# Patient Record
Sex: Female | Born: 1940 | Race: White | Hispanic: No | Marital: Married
Health system: Southern US, Community
[De-identification: ages and names within clinical notes are randomized; demographics above are authoritative.]

---

## 2003-01-29 ENCOUNTER — Encounter: Payer: Self-pay | Admitting: Thoracic Surgery (Cardiothoracic Vascular Surgery)

## 2003-01-31 ENCOUNTER — Inpatient Hospital Stay (HOSPITAL_COMMUNITY)
Admission: RE | Admit: 2003-01-31 | Discharge: 2003-02-04 | Payer: Self-pay | Admitting: Thoracic Surgery (Cardiothoracic Vascular Surgery)

## 2003-01-31 ENCOUNTER — Encounter: Payer: Self-pay | Admitting: Thoracic Surgery (Cardiothoracic Vascular Surgery)

## 2003-02-01 ENCOUNTER — Encounter: Payer: Self-pay | Admitting: Thoracic Surgery (Cardiothoracic Vascular Surgery)

## 2003-02-02 ENCOUNTER — Encounter: Payer: Self-pay | Admitting: Thoracic Surgery (Cardiothoracic Vascular Surgery)

## 2003-02-03 ENCOUNTER — Encounter: Payer: Self-pay | Admitting: Thoracic Surgery (Cardiothoracic Vascular Surgery)

## 2003-02-03 ENCOUNTER — Encounter: Payer: Self-pay | Admitting: Cardiothoracic Surgery

## 2003-02-04 ENCOUNTER — Encounter: Payer: Self-pay | Admitting: Cardiothoracic Surgery

## 2003-02-26 ENCOUNTER — Encounter
Admission: RE | Admit: 2003-02-26 | Discharge: 2003-02-26 | Payer: Self-pay | Admitting: Thoracic Surgery (Cardiothoracic Vascular Surgery)

## 2003-02-26 ENCOUNTER — Encounter: Payer: Self-pay | Admitting: Thoracic Surgery (Cardiothoracic Vascular Surgery)

## 2017-03-27 ENCOUNTER — Emergency Department (HOSPITAL_COMMUNITY): Payer: Medicare Other

## 2017-03-27 ENCOUNTER — Emergency Department (HOSPITAL_COMMUNITY)
Admission: EM | Admit: 2017-03-27 | Discharge: 2017-03-27 | Disposition: A | Discharge status: Home / Self Care | Payer: Medicare Other | Attending: Emergency Medicine | Admitting: Emergency Medicine

## 2017-03-27 DIAGNOSIS — Y939 Activity, unspecified: Secondary | ICD-10-CM | POA: Insufficient documentation

## 2017-03-27 DIAGNOSIS — S4992XA Unspecified injury of left shoulder and upper arm, initial encounter: Secondary | ICD-10-CM | POA: Diagnosis present

## 2017-03-27 DIAGNOSIS — S43035A Inferior dislocation of left humerus, initial encounter: Secondary | ICD-10-CM | POA: Diagnosis not present

## 2017-03-27 DIAGNOSIS — W01190A Fall on same level from slipping, tripping and stumbling with subsequent striking against furniture, initial encounter: Secondary | ICD-10-CM | POA: Diagnosis not present

## 2017-03-27 DIAGNOSIS — Y999 Unspecified external cause status: Secondary | ICD-10-CM | POA: Insufficient documentation

## 2017-03-27 DIAGNOSIS — Y9222 Religious institution as the place of occurrence of the external cause: Secondary | ICD-10-CM | POA: Insufficient documentation

## 2017-03-27 MED ORDER — HYDROMORPHONE HCL 1 MG/ML IJ SOLN
0.5000 mg | Freq: Once | INTRAMUSCULAR | Status: AC
  Administered 2017-03-27: 0.5 mg via INTRAVENOUS
  Filled 2017-03-27: qty 1

## 2017-03-27 MED ORDER — ONDANSETRON HCL 4 MG/2ML IJ SOLN
4.0000 mg | Freq: Once | INTRAMUSCULAR | Status: AC
  Administered 2017-03-27: 4 mg via INTRAVENOUS
  Filled 2017-03-27: qty 2

## 2017-03-27 MED ORDER — HYDROMORPHONE HCL 1 MG/ML IJ SOLN: 1.0000 mg | Freq: Once | INTRAMUSCULAR | Status: DC

## 2017-03-27 NOTE — ED Provider Notes (Signed)
[facility]-EMERGENCY DEPT Provider Note   CSN: [id number] Arrival date & time: 03/27/17  1326     History   Chief Complaint No chief complaint on file.   HPI Tiffany Johnson is a 76 y.o. female who presents with a fall and L shoulder pain. She was at church and tripped between two chairs, hit a wall, and then fell on the floor. She denies LOC. EMS transported patient due to severe left shoulder pain. She denies headache, neck pain, chest pain, SOB, abdominal pain, other extremity pain. She does have numbness of her left hand is unable to move her left fingers and wrist.   HPI  No past medical history on file.  There are no active problems to display for this patient.   No past surgical history on file.  OB History    No data available       Home Medications    Prior to Admission medications   Not on File    Family History No family history on file.  Social History Social History  Substance Use Topics  . Smoking status: Not on file  . Smokeless tobacco: Not on file  . Alcohol use Not on file     Allergies   Patient has no allergy information on record.   Review of Systems Review of Systems  Respiratory: Negative for shortness of breath.   Cardiovascular: Negative for chest pain.  Gastrointestinal: Negative for abdominal pain.  Musculoskeletal: Positive for arthralgias, joint swelling and myalgias. Negative for gait problem and neck pain.  Skin: Negative for wound.  Neurological: Negative for dizziness, syncope, weakness and headaches.  Psychiatric/Behavioral: Negative for confusion.  All other systems reviewed and are negative.    Physical Exam Updated Vital Signs BP (!) 164/78 (BP Location: Right Arm)   Pulse 71   Temp 97.6 F (36.4 C) (Oral)   Resp 16   SpO2 100%   Physical Exam  Constitutional: She is oriented to person, place, and time. She appears well-developed and well-nourished. She appears distressed.  HENT:    Head: Normocephalic.  Minimal bruising over forehead  Eyes: Pupils are equal, round, and reactive to light. Conjunctivae are normal. Right eye exhibits no discharge. Left eye exhibits no discharge. No scleral icterus.  Neck: Normal range of motion.  Cardiovascular: Normal rate and regular rhythm.  Exam reveals no gallop and no friction rub.   No murmur heard. Pulmonary/Chest: Effort normal and breath sounds normal. No respiratory distress. She has no wheezes. She has no rales. She exhibits no tenderness.  Abdominal: Soft. Bowel sounds are normal. She exhibits no distension. There is no tenderness.  Musculoskeletal:  Left upper extremity: Obvious deformity of left shoulder - patient is in sling and holding arm to chest with elbow flexed. Diffuse tenderness to palpation of shoulder. No tenderness of humerus, elbow, forearm, wrist, hand. Absent radial pulse. Unable to wiggle fingers.  Right knee: Ecchymosis of knee. No obvious swelling, deformity, or warmth. Minimal tenderness. FROM.   Neurological: She is alert and oriented to person, place, and time.  Skin: Skin is warm and dry.  Psychiatric: She has a normal mood and affect. Her behavior is normal.  Nursing note and vitals reviewed.    ED Treatments / Results  Labs (all labs ordered are listed, but only abnormal results are displayed) Labs Reviewed - No data to display  EKG  EKG Interpretation None       Radiology Dg Shoulder Left Portable  Result Date:  03/27/2017 CLINICAL DATA:  Pain after trauma EXAM: LEFT SHOULDER - 1 VIEW COMPARISON:  None. FINDINGS: Irregularity along the lateral humeral head is incompletely evaluated due the lack of an externally rotated view. However, the findings are suspicious for fracture fragments. No fracture lines are seen extending through the proximal humerus. No dislocation identified. The clavicle and scapula are grossly intact. IMPRESSION: 1. Irregularity along the lateral left humeral head is  concerning for subtle fracture fragments. A donor site is not well identified on this study due to lack of external rotation. No fracture lines are seen extending through the humeral head or neck. No dislocation. Electronically Signed   By: Gerome Samavid  Williams III M.D   On: 03/27/2017 15:12   Dg Knee Right Port  Result Date: 03/27/2017 CLINICAL DATA:  Pain after fall. EXAM: PORTABLE RIGHT KNEE - 1-2 VIEW COMPARISON:  None. FINDINGS: No evidence of fracture, dislocation, or joint effusion. No evidence of arthropathy or other focal bone abnormality. Soft tissues are unremarkable. IMPRESSION: Negative. Electronically Signed   By: Gerome Samavid  Williams III M.D   On: 03/27/2017 15:09    Procedures Procedures (including critical care time)  Medications Ordered in ED Medications  ondansetron [facility](ZOFRAN) injection 4 mg (4 mg Intravenous Given 03/27/17 1408)  HYDROmorphone (DILAUDID) injection 0.5 mg (0.5 mg Intravenous Given 03/27/17 1408)     Initial Impression / Assessment and Plan / ED Course  I have reviewed the triage vital signs and the nursing notes.  Pertinent labs & imaging results that were available during my care of the patient were reviewed by me and considered in my medical decision making (see chart for details).  76 year old female presents with inferior shoulder dislocation after a mechanical fall. Shoulder was successfully reduced by Dr. Silverio LayYao. Pt reported immediate improvement in symptoms after reduction. Portable shoulder and knee xray were obtained which shows small fracture fragments. Pt was placed in sling. Advised Tylenol/Ibuprofen for pain and f/u with Ortho. Patient and sister verbalized understanding.  Final Clinical Impressions(s) / ED Diagnoses   Final diagnoses:  Luxatio erecta of left shoulder, initial encounter    New Prescriptions New Prescriptions   No medications on file     Bethel BornGekas, Bradden Tadros Marie, PA-C 03/27/17 1600    Charlynne PanderYao, David Hsienta, MD 03/28/17 [phone]

## 2017-03-27 NOTE — Discharge Instructions (Signed)
Take Tylenol or Ibuprofen for pain Use sling for the next week Follow up with Orthopedics

## 2017-03-27 NOTE — ED Notes (Signed)
Bed: [id number] Expected date:  Expected time:  Means of arrival:  Comments: Shoulder injury.

## 2017-03-27 NOTE — ED Triage Notes (Signed)
Patient came by ems with c/o fall and shoulder injury. Pt fell at church and tripe over a chair. Pt c/o left shoulder pain at this time. Possible deformity. Pt received 100 mcg of fentanyl per ems.

## 2017-03-28 NOTE — ED Provider Notes (Signed)
Medical screening examination/treatment/procedure(s) were conducted as a shared visit with non-physician practitioner(s) and myself.  I personally evaluated the patient during the encounter.   EKG Interpretation None      Tiffany Johnson is a 76 y.o. female here with fall. Was at church and tripped and fell and hit her shoulder on the wall and landed on right knee. Denies head injury or LOC. I was called into the room because the PA was unable to feel good pulses on L radial and patient has tingling L arm. On exam, there is obvious inferior L shoulder dislocation, diminished L radial pulse, able to hand grasp, no proximal humerus deformity. I applied traction and was able to relocate the dislocation and the radial pulse became normal and patient felt better. No scalp hematoma, no midline spinal tenderness. Mild bruising R knee but no obvious deformity. Post reduction xrays showed that shoulder is in place, small fracture fragment that is typical for inferior shoulder dislocation. R knee xray unremarkable. Patient placed in shoulder immobilizer, will give ortho follow up.   Shoulder Reduction Procedure Note  The left shoulder was reduced with the patient reaching. The Traction-Countertraction maneuver was used. Reduction was successful and confirmed by post reduction X-Rays. Repeat exam of the extremity reveals normal sensation and circulation.  Post-Reduction X-rays reveal that the proximal humerus is reduced in the glenoid fossa.     Charlynne PanderYao, David Hsienta, MD 03/28/17 [phone]

## 2018-07-27 IMAGING — DX DG KNEE 1-2V PORT*R*
4 series · 4 of 4 positions shown · non-contrast
Comparison: None.

CLINICAL DATA: Pain after fall.

EXAM:
PORTABLE RIGHT KNEE - 1-2 VIEW

[knee ap]
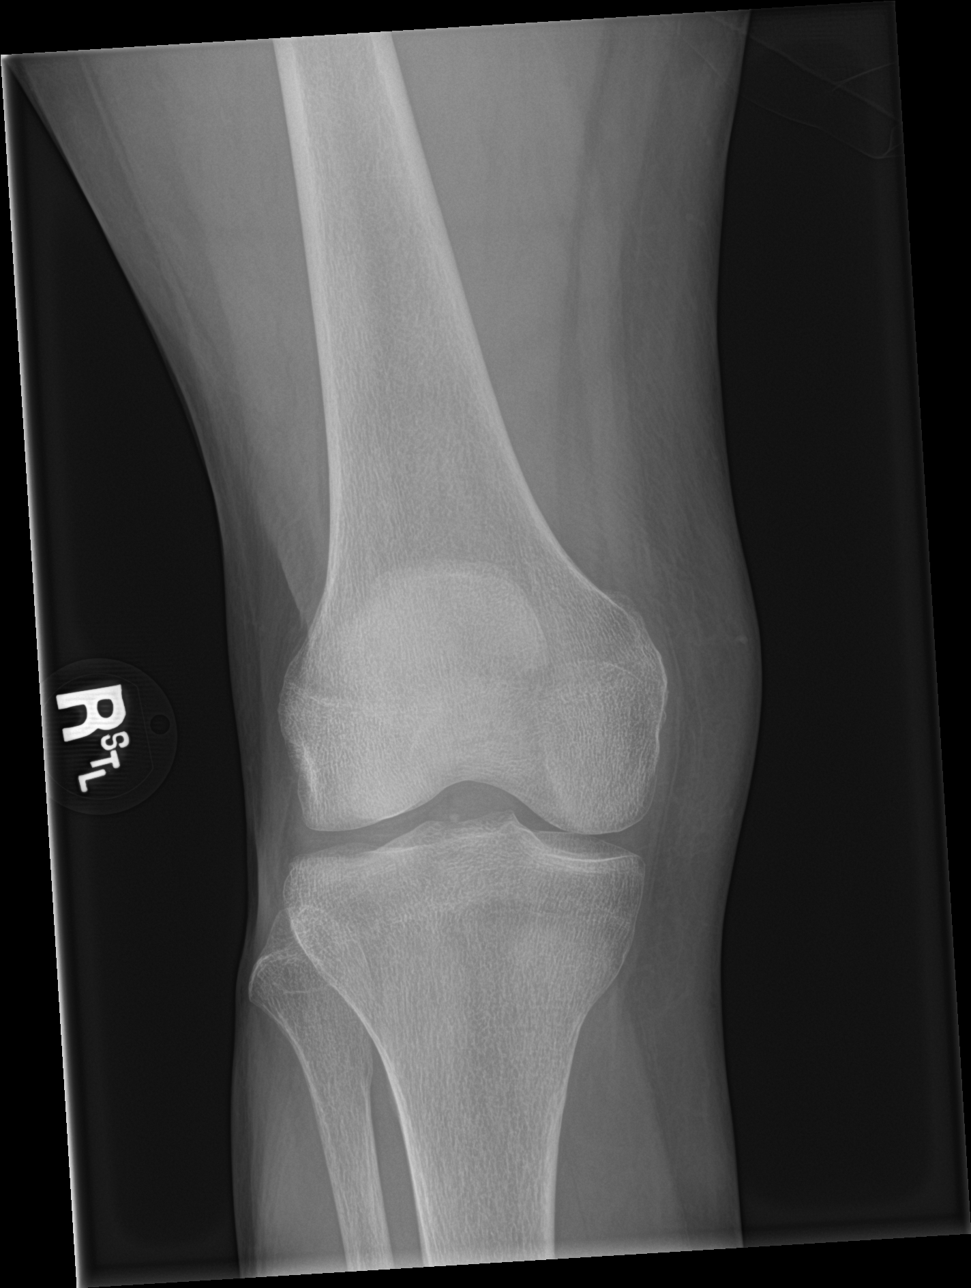

[knee obl (1 of 2)]
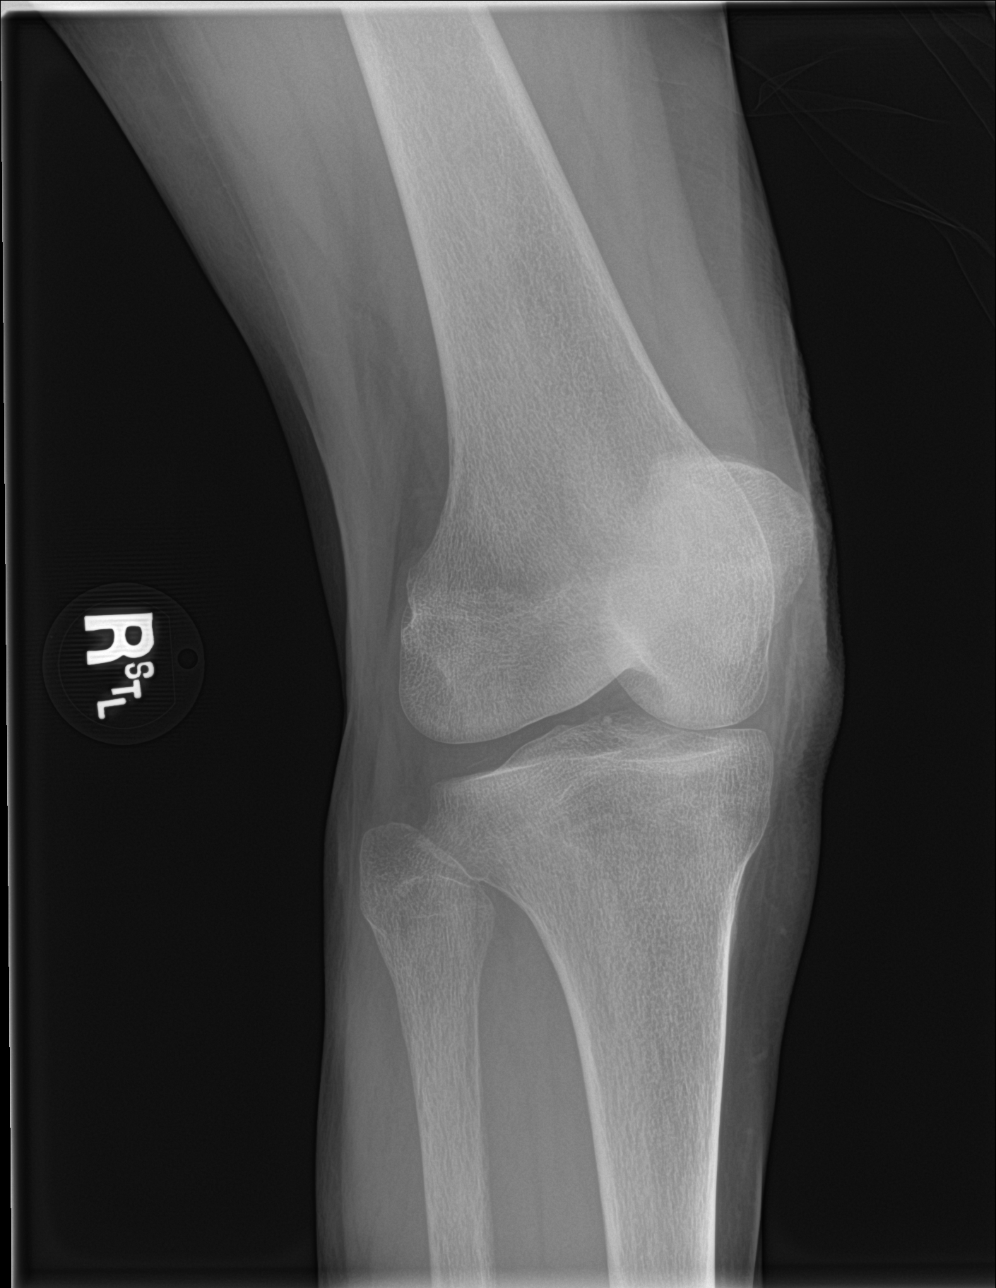

[knee obl (2 of 2)]
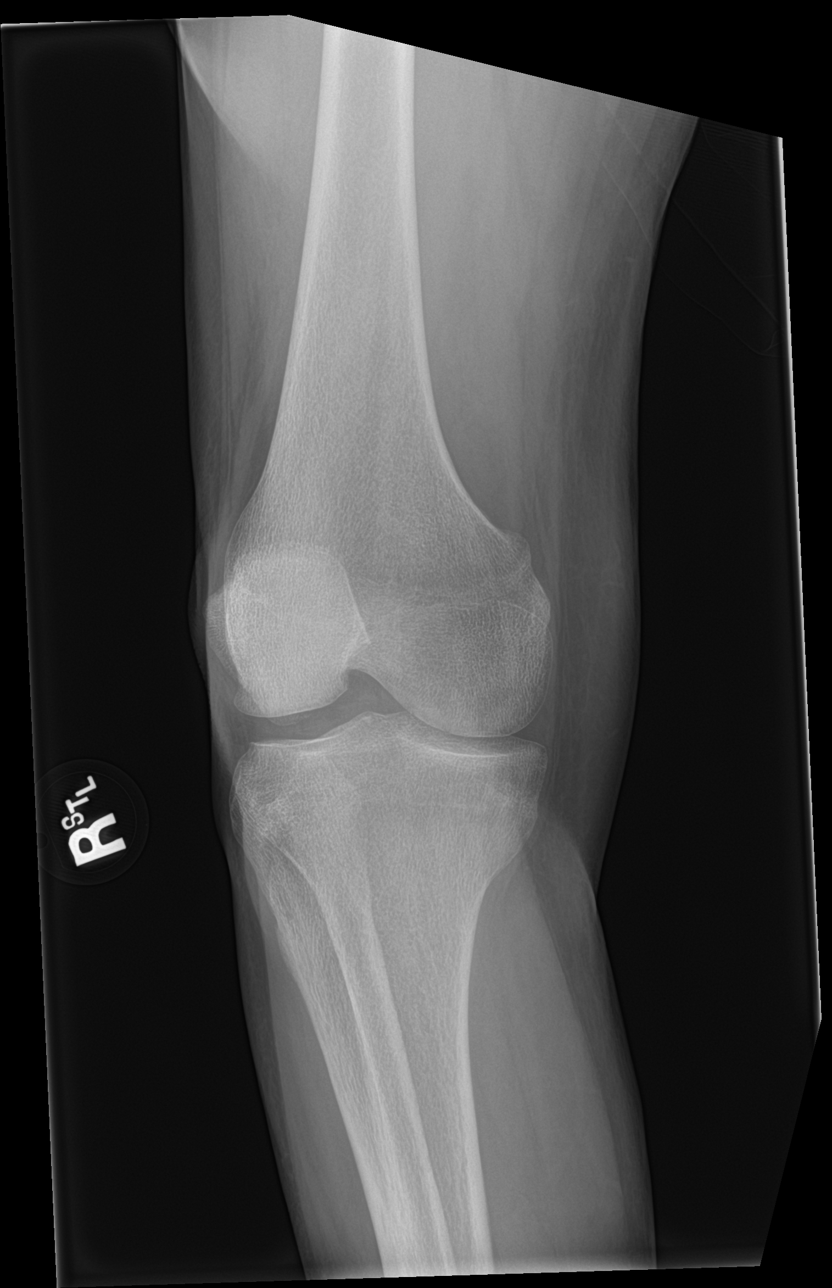

[knee lat]
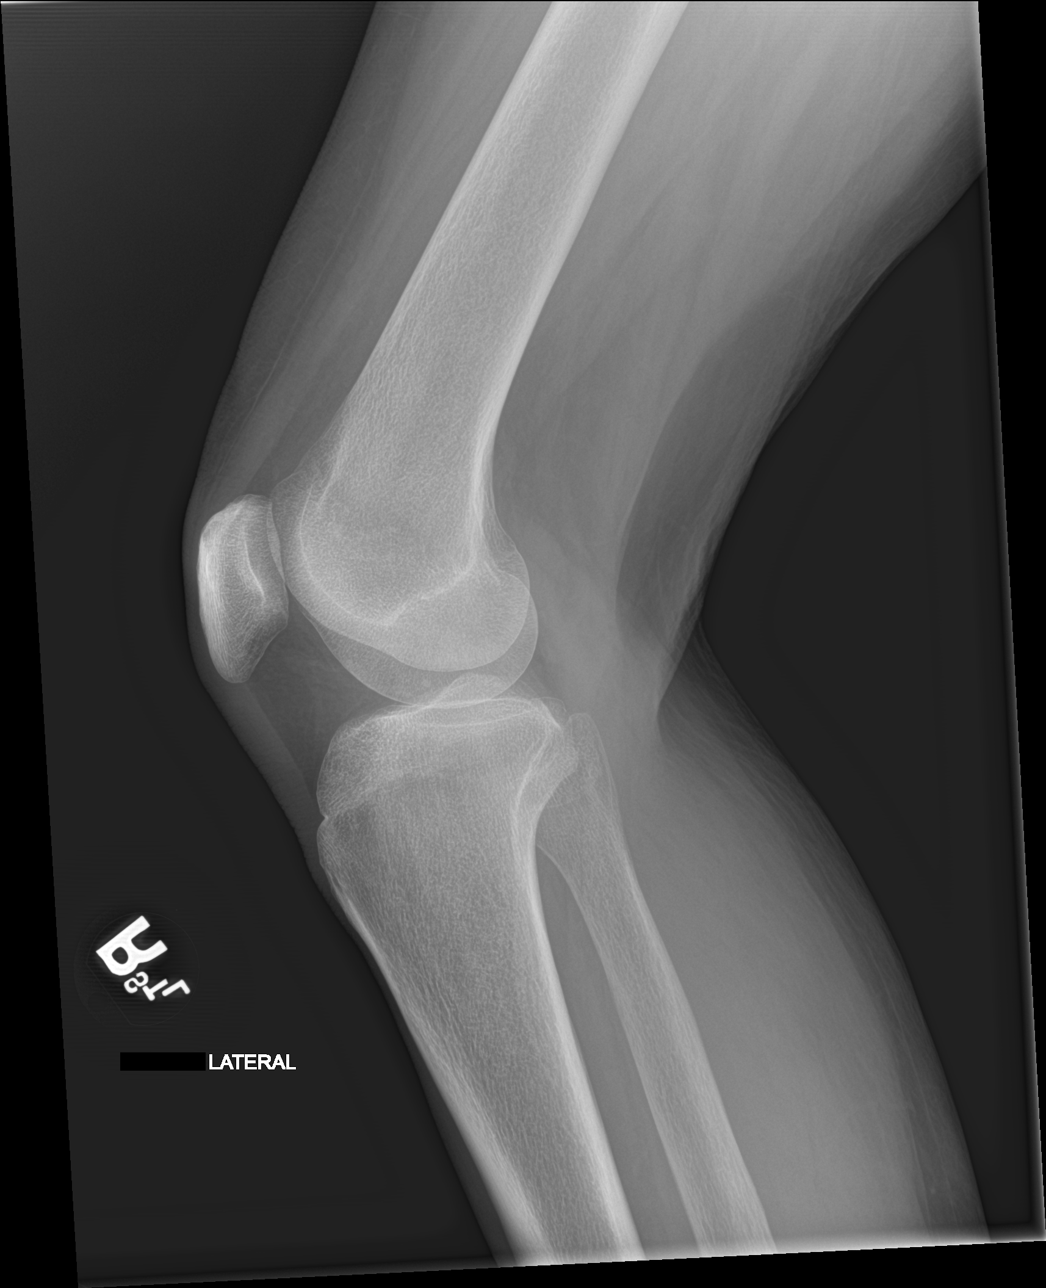

[4 of 4 positions shown; findings below may reference images not displayed]

FINDINGS: No evidence of fracture, dislocation, or joint effusion. No evidence
of arthropathy or other focal bone abnormality. Soft tissues are
unremarkable.
IMPRESSION: Negative.
